# Patient Record
Sex: Male | Born: 1990 | Race: Black or African American | Hispanic: No | Marital: Single | Smoking: Current every day smoker
Health system: Southern US, Community
[De-identification: ages and names within clinical notes are randomized; demographics above are authoritative.]

---

## 2016-03-03 DIAGNOSIS — F319 Bipolar disorder, unspecified: Secondary | ICD-10-CM | POA: Insufficient documentation

## 2016-09-14 ENCOUNTER — Encounter: Payer: Self-pay | Admitting: *Deleted

## 2016-09-14 ENCOUNTER — Emergency Department
Admission: EM | Admit: 2016-09-14 | Discharge: 2016-09-14 | Disposition: A | Discharge status: Home / Self Care | Payer: Self-pay | Attending: Emergency Medicine | Admitting: Emergency Medicine

## 2016-09-14 ENCOUNTER — Emergency Department: Payer: Self-pay

## 2016-09-14 DIAGNOSIS — J9801 Acute bronchospasm: Secondary | ICD-10-CM | POA: Insufficient documentation

## 2016-09-14 DIAGNOSIS — F17200 Nicotine dependence, unspecified, uncomplicated: Secondary | ICD-10-CM | POA: Insufficient documentation

## 2016-09-14 MED ORDER — IPRATROPIUM-ALBUTEROL 0.5-2.5 (3) MG/3ML IN SOLN
3.0000 mL | Freq: Once | RESPIRATORY_TRACT | Status: AC
  Administered 2016-09-14: 3 mL via RESPIRATORY_TRACT
  Filled 2016-09-14: qty 3

## 2016-09-14 MED ORDER — BENZONATATE 100 MG PO CAPS: 100.0000 mg | ORAL_CAPSULE | Freq: Three times a day (TID) | ORAL | 0 refills | Status: AC | PRN

## 2016-09-14 NOTE — ED Provider Notes (Signed)
[facility] Emergency Department Provider Note ____________________________________________  Time seen: 2206  I have reviewed the triage vital signs and the nursing notes.  HISTORY  Chief Complaint  Cough and Wheezing  HPI Francis Riley is a 25 y.o. male presents to the ED for evaluation of a complaint of cough congestion, wheezing with onset about an hour prior to arrival. The patient was resting quite comfortably in the room as I entered. He had to be aroused and shaken several times to complete the interview. The patient with a history of tobacco use, denies any recent injury or illness or history of asthma or bronchitis. He denies any chest pain, shortness of breath, or dyspnea. He reports his symptoms onset while he was at home sleeping. He denies any exertional symptoms.  History reviewed. No pertinent past medical history.  There are no active problems to display for this patient.  History reviewed. No pertinent surgical history.  Prior to Admission medications   Medication Sig Start Date End Date Taking? Authorizing Provider  benzonatate (TESSALON PERLES) 100 MG capsule Take 1 capsule (100 mg total) by mouth 3 (three) times daily as needed for cough (Take 1-2 per dose). 09/14/16   Telly Jawad V Bacon Damani Kelemen, PA-C   Allergies Benadryl [diphenhydramine hcl]  No family history on file.  Social History Social History  Substance Use Topics  . Smoking status: Current Every Day Smoker  . Smokeless tobacco: Never Used  . Alcohol use Yes    Review of Systems  Constitutional: Negative for fever. Eyes: Negative for visual changes. ENT: Negative for sore throat. Cardiovascular: Negative for chest pain. Respiratory: Negative for shortness of breath.Reports wheezing and congestion as above. Skin: Negative for rash. Neurological: Negative for headaches, focal weakness or numbness. ____________________________________________  PHYSICAL EXAM:  VITAL  SIGNS: ED Triage Vitals  Enc Vitals Group     BP 09/14/16 2108 132/68     Pulse Rate 09/14/16 2108 83     Resp 09/14/16 2108 18     Temp 09/14/16 2108 98.4 F (36.9 C)     Temp Source 09/14/16 2108 Oral     SpO2 09/14/16 2108 98 %     Weight 09/14/16 2109 150 lb (68 kg)     Height 09/14/16 2109 5\' 7"  (1.702 m)     Head Circumference --      Peak Flow --      Pain Score 09/14/16 2109 6     Pain Loc --      Pain Edu? --      Excl. in GC? --    Constitutional: Alert and oriented. Well appearing and in no distress. Head: Normocephalic and atraumatic. Eyes: Conjunctivae are normal. PERRL. Normal extraocular movements Ears: Canals clear. TMs intact bilaterally. Nose: No congestion/rhinorrhea/epistaxis. Mouth/Throat: Mucous membranes are moist. Neck: Supple. No thyromegaly. Hematological/Lymphatic/Immunological: No cervical lymphadenopathy. Cardiovascular: Normal rate, regular rhythm. Normal distal pulses. Respiratory: Normal respiratory effort. No wheezes/rales/rhonchi. No noisy breathing snoring or signs of any congestion. Gastrointestinal: Soft and nontender. No distention. Skin:  Skin is warm, dry and intact. No rash noted. ____________________________________________   RADIOLOGY  CXR IMPRESSION: No active cardiopulmonary disease. Dextroscoliosis lower thoracic spine.  I, Job Holtsclaw, Charlesetta IvoryJenise V Bacon, personally viewed and evaluated these images (plain radiographs) as part of my medical decision making, as well as reviewing the written report by the radiologist. ____________________________________________  PROCEDURES  Duoneb x 1 ____________________________________________  INITIAL IMPRESSION / ASSESSMENT AND PLAN / ED COURSE  Patient with mild intermittent bronchospasm without radiologic  evidence of acute cardiac process. He is encouraged to discontinue or cut back on his smoking. Patient was also made aware of his mild dextroscoliosis on the x-ray finding. He will follow  with one of the local community clinics for ongoing symptom management. A tessalon perle prescription is provided.   Clinical Course    ____________________________________________  FINAL CLINICAL IMPRESSION(S) / ED DIAGNOSES  Final diagnoses:  Bronchospasm      Lissa HoardJenise V Bacon Luceil Herrin, PA-C 09/14/16 2242    Arnaldo NatalPaul F Malinda, MD 09/14/16 2310

## 2016-09-14 NOTE — Discharge Instructions (Signed)
Take the prescription cough medicine as needed. Follow-up with one of the community clinics as needed. Consider cutting back on cigarette smoking to reduce symptoms.

## 2016-09-14 NOTE — ED Triage Notes (Signed)
Pt has cough, congestion and wheezing today.  No hx asthma.  No fever .   cig smoker Pt alert.

## 2018-10-20 ENCOUNTER — Emergency Department

## 2018-10-20 ENCOUNTER — Emergency Department
Admission: EM | Admit: 2018-10-20 | Discharge: 2018-10-20 | Disposition: A | Discharge status: Home / Self Care | Attending: Emergency Medicine | Admitting: Emergency Medicine

## 2018-10-20 ENCOUNTER — Other Ambulatory Visit: Payer: Self-pay

## 2018-10-20 DIAGNOSIS — T1490XA Injury, unspecified, initial encounter: Secondary | ICD-10-CM

## 2018-10-20 DIAGNOSIS — R51 Headache: Secondary | ICD-10-CM | POA: Diagnosis present

## 2018-10-20 DIAGNOSIS — M549 Dorsalgia, unspecified: Secondary | ICD-10-CM | POA: Insufficient documentation

## 2018-10-20 DIAGNOSIS — H02846 Edema of left eye, unspecified eyelid: Secondary | ICD-10-CM | POA: Diagnosis not present

## 2018-10-20 DIAGNOSIS — R109 Unspecified abdominal pain: Secondary | ICD-10-CM | POA: Diagnosis not present

## 2018-10-20 DIAGNOSIS — R600 Localized edema: Secondary | ICD-10-CM

## 2018-10-20 DIAGNOSIS — M542 Cervicalgia: Secondary | ICD-10-CM | POA: Diagnosis not present

## 2018-10-20 DIAGNOSIS — Y92148 Other place in prison as the place of occurrence of the external cause: Secondary | ICD-10-CM | POA: Insufficient documentation

## 2018-10-20 DIAGNOSIS — F17200 Nicotine dependence, unspecified, uncomplicated: Secondary | ICD-10-CM | POA: Insufficient documentation

## 2018-10-20 DIAGNOSIS — R220 Localized swelling, mass and lump, head: Secondary | ICD-10-CM

## 2018-10-20 DIAGNOSIS — Y999 Unspecified external cause status: Secondary | ICD-10-CM | POA: Diagnosis not present

## 2018-10-20 DIAGNOSIS — Y939 Activity, unspecified: Secondary | ICD-10-CM | POA: Insufficient documentation

## 2018-10-20 LAB — CBC WITH DIFFERENTIAL/PLATELET
Abs Immature Granulocytes: 0.06 10*3/uL (ref 0.00–0.07)
Basophils Absolute: 0 10*3/uL (ref 0.0–0.1)
Basophils Relative: 0 %
Eosinophils Absolute: 0 10*3/uL (ref 0.0–0.5)
Eosinophils Relative: 0 %
HCT: 44.4 % (ref 39.0–52.0)
Hemoglobin: 15 g/dL (ref 13.0–17.0)
Immature Granulocytes: 0 %
Lymphocytes Relative: 10 %
Lymphs Abs: 1.5 10*3/uL (ref 0.7–4.0)
MCH: 29 pg (ref 26.0–34.0)
MCHC: 33.8 g/dL (ref 30.0–36.0)
MCV: 85.9 fL (ref 80.0–100.0)
Monocytes Absolute: 1.3 10*3/uL — ABNORMAL HIGH (ref 0.1–1.0)
Monocytes Relative: 9 %
Neutro Abs: 12.2 10*3/uL — ABNORMAL HIGH (ref 1.7–7.7)
Neutrophils Relative %: 81 %
Platelets: 158 10*3/uL (ref 150–400)
RBC: 5.17 MIL/uL (ref 4.22–5.81)
RDW: 11.5 % (ref 11.5–15.5)
WBC: 15.1 10*3/uL — ABNORMAL HIGH (ref 4.0–10.5)
nRBC: 0 % (ref 0.0–0.2)

## 2018-10-20 LAB — BASIC METABOLIC PANEL
Anion gap: 10 (ref 5–15)
BUN: 19 mg/dL (ref 6–20)
CALCIUM: 9.8 mg/dL (ref 8.9–10.3)
CO2: 22 mmol/L (ref 22–32)
CREATININE: 0.9 mg/dL (ref 0.61–1.24)
Chloride: 106 mmol/L (ref 98–111)
GFR calc Af Amer: >60 mL/min (ref >60)
GFR calc non Af Amer: >60 mL/min (ref >60)
GLUCOSE: 90 mg/dL (ref 70–99)
Potassium: 4.1 mmol/L (ref 3.5–5.1)
Sodium: 138 mmol/L (ref 135–145)

## 2018-10-20 MED ORDER — ACETAMINOPHEN 500 MG PO TABS
1000.0000 mg | ORAL_TABLET | Freq: Once | ORAL | Status: AC
  Administered 2018-10-20: 1000 mg via ORAL
  Filled 2018-10-20: qty 2

## 2018-10-20 MED ORDER — OXYCODONE-ACETAMINOPHEN 10-325 MG PO TABS: 1.0000 | ORAL_TABLET | Freq: Four times a day (QID) | ORAL | 0 refills | Status: AC | PRN

## 2018-10-20 MED ORDER — ONDANSETRON 4 MG PO TBDP
4.0000 mg | ORAL_TABLET | Freq: Once | ORAL | Status: AC
  Administered 2018-10-20: 4 mg via ORAL
  Filled 2018-10-20: qty 1

## 2018-10-20 MED ORDER — MORPHINE SULFATE (PF) 4 MG/ML IV SOLN
4.0000 mg | Freq: Once | INTRAVENOUS | Status: AC
  Administered 2018-10-20: 4 mg via INTRAVENOUS
  Filled 2018-10-20: qty 1

## 2018-10-20 MED ORDER — ONDANSETRON 4 MG PO TBDP: 4.0000 mg | ORAL_TABLET | Freq: Three times a day (TID) | ORAL | 0 refills | Status: AC | PRN

## 2018-10-20 MED ORDER — OXYCODONE HCL 5 MG PO TABS
10.0000 mg | ORAL_TABLET | Freq: Once | ORAL | Status: AC
  Administered 2018-10-20: 10 mg via ORAL
  Filled 2018-10-20: qty 2

## 2018-10-20 MED ORDER — IOPAMIDOL (ISOVUE-300) INJECTION 61%
100.0000 mL | Freq: Once | INTRAVENOUS | Status: AC | PRN
  Administered 2018-10-20: 100 mL via INTRAVENOUS

## 2018-10-20 MED ORDER — ONDANSETRON HCL 4 MG/2ML IJ SOLN
4.0000 mg | Freq: Once | INTRAMUSCULAR | Status: AC
  Administered 2018-10-20: 4 mg via INTRAVENOUS
  Filled 2018-10-20: qty 2

## 2018-10-20 NOTE — ED Notes (Signed)
Police officer at bedside to conduct report.
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
## 2018-10-20 NOTE — ED Notes (Signed)
Patient complain of nausea. Po med given as ordered. Discharge pending.
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
## 2018-10-20 NOTE — ED Notes (Signed)
Patient of unit to ct head and xrays to be completed.
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
## 2020-03-03 ENCOUNTER — Emergency Department: Admission: EM | Admit: 2020-03-03 | Discharge: 2020-03-03 | Discharge status: Against Medical Advice
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
## 2020-12-01 ENCOUNTER — Ambulatory Visit

## 2020-12-03 ENCOUNTER — Other Ambulatory Visit: Payer: Self-pay

## 2020-12-03 ENCOUNTER — Ambulatory Visit

## 2020-12-03 ENCOUNTER — Ambulatory Visit: Payer: Self-pay | Admitting: Physician Assistant

## 2020-12-03 DIAGNOSIS — Z202 Contact with and (suspected) exposure to infections with a predominantly sexual mode of transmission: Secondary | ICD-10-CM

## 2020-12-03 DIAGNOSIS — Z113 Encounter for screening for infections with a predominantly sexual mode of transmission: Secondary | ICD-10-CM

## 2020-12-03 LAB — GRAM STAIN

## 2020-12-03 MED ORDER — METRONIDAZOLE 500 MG PO TABS: 2000.0000 mg | ORAL_TABLET | Freq: Once | ORAL | 0 refills | Status: AC

## 2020-12-03 NOTE — Progress Notes (Unsigned)
Gram stain reviewed. Dispensed 2 grams Metronidazole per provider order. Provider orders completed.

## 2020-12-03 NOTE — Progress Notes (Unsigned)
Patient here for std testing. Harvie Heck, RN

## 2020-12-03 NOTE — Progress Notes (Signed)
Francis Riley

## 2020-12-04 ENCOUNTER — Encounter: Payer: Self-pay | Admitting: Physician Assistant

## 2020-12-04 NOTE — Progress Notes (Signed)
[facility] Department STI clinic/screening visit  Subjective:  Francis Riley is a 30 y.o. male being seen today for an STI screening visit. The patient reports they do not have symptoms.    Patient has the following medical conditions:   Patient Active Problem List   Diagnosis Date Noted  . Bipolar 1 disorder (HCC) 03/03/2016     Chief Complaint  Patient presents with  . SEXUALLY TRANSMITTED DISEASE    screening    HPI  Patient reports that Francis Riley is not having any symptoms but is a contact to Trich.  Denies chronic conditions, surgeries and regular medicines.  States last HIV test was 2-3 years ago and last void prior to sample collection for Gram stain was about 1 hour ago.   See flowsheet for further details and programmatic requirements.    The following portions of the patient's history were reviewed and updated as appropriate: allergies, current medications, past medical history, past social history, past surgical history and problem list.  Objective:  There were no vitals filed for this visit.  Physical Exam Constitutional:      General: Francis Riley is not in acute distress.    Appearance: Normal appearance.  HENT:     Head: Normocephalic and atraumatic.     Comments: No nits,lice, or hair loss. No cervical, supraclavicular or axillary adenopathy.    Mouth/Throat:     Mouth: Mucous membranes are moist.     Pharynx: Oropharynx is clear. No oropharyngeal exudate or posterior oropharyngeal erythema.  Eyes:     Conjunctiva/sclera: Conjunctivae normal.  Pulmonary:     Effort: Pulmonary effort is normal.  Abdominal:     Palpations: Abdomen is soft. There is no mass.     Tenderness: There is no abdominal tenderness. There is no guarding or rebound.  Genitourinary:    Penis: Normal.      Testes: Normal.     Comments: Pubic area without nits, lice, hair loss, edema, erythema, lesions and inguinal adenopathy. Penis circumcised without rash, lesions and discharge at  meatus. Musculoskeletal:     Cervical back: Neck supple. No tenderness.  Skin:    General: Skin is warm and dry.     Findings: No bruising, erythema, lesion or rash.  Neurological:     Mental Status: Francis Riley is alert and oriented to person, place, and time.  Psychiatric:        Mood and Affect: Mood normal.        Behavior: Behavior normal.        Thought Content: Thought content normal.        Judgment: Judgment normal.       Assessment and Plan:  Francis Riley is a 30 y.o. male presenting to the [facility] Department for STI screening  1. Screening for STD (sexually transmitted disease) Patient into clinic without symptoms. Rec condoms with all sex. Await test results.  Counseled that RN will call if needs to RTC for treatment once results are back. - Gram stain - Gonococcus culture - HIV [facility] - Syphilis Serology, [facility]  2. Trichomonas contact Will treat as a contact to Trich with Metronidazole 2 g po at one time with food, no EtOH for 24 hr before and until 72 hr after completing medicine. No sex for 10 days and until after partner completes treatment. RTC for re-reatment if vomits < 2 hr after taking medicine. - metroNIDAZOLE (FLAGYL) 500 MG tablet; Take 4 tablets (2,000 mg total) by mouth  once for 1 dose.  Dispense: 4 tablet; Refill: 0     No follow-ups on file.  No future appointments.  Matt Holmes, PA

## 2020-12-08 LAB — GONOCOCCUS CULTURE
